# Patient Record
Sex: Male | Born: 1945 | Race: White | Hispanic: No
Health system: Southern US, Community
[De-identification: ages and names within clinical notes are randomized; demographics above are authoritative.]

---

## 2019-07-03 ENCOUNTER — Other Ambulatory Visit (HOSPITAL_COMMUNITY): Payer: Self-pay | Admitting: Physician Assistant

## 2019-07-03 DIAGNOSIS — R911 Solitary pulmonary nodule: Secondary | ICD-10-CM

## 2019-07-16 ENCOUNTER — Ambulatory Visit (HOSPITAL_COMMUNITY)
Admission: RE | Admit: 2019-07-16 | Discharge: 2019-07-16 | Disposition: A | Discharge status: Home / Self Care | Payer: Medicare HMO | Source: Ambulatory Visit | Attending: Physician Assistant | Admitting: Physician Assistant

## 2019-07-16 ENCOUNTER — Other Ambulatory Visit: Payer: Self-pay

## 2019-07-16 DIAGNOSIS — R911 Solitary pulmonary nodule: Secondary | ICD-10-CM | POA: Diagnosis not present

## 2019-07-16 MED ORDER — FLUDEOXYGLUCOSE F - 18 (FDG) INJECTION
13.9000 | Freq: Once | INTRAVENOUS | Status: AC | PRN
  Administered 2019-07-16: 15:00:00 13.9 via INTRAVENOUS

## 2019-07-17 ENCOUNTER — Other Ambulatory Visit (HOSPITAL_COMMUNITY): Payer: Self-pay | Admitting: Physician Assistant

## 2019-08-16 ENCOUNTER — Institutional Professional Consult (permissible substitution): Payer: Medicare HMO | Admitting: Emergency Medicine

## 2019-10-04 ENCOUNTER — Institutional Professional Consult (permissible substitution): Payer: Medicare HMO | Admitting: Emergency Medicine

## 2019-10-04 ENCOUNTER — Telehealth: Payer: Self-pay | Admitting: Emergency Medicine

## 2019-10-04 NOTE — Telephone Encounter (Signed)
Left pt voicemail to call office to reschedule missed consult with RB. Pt needs to reschedule with either RB or BI per RB.

## 2020-03-05 ENCOUNTER — Other Ambulatory Visit: Payer: Self-pay | Admitting: Physician Assistant

## 2020-03-05 ENCOUNTER — Other Ambulatory Visit (HOSPITAL_COMMUNITY): Payer: Self-pay | Admitting: Physician Assistant

## 2020-03-05 DIAGNOSIS — R911 Solitary pulmonary nodule: Secondary | ICD-10-CM

## 2020-12-01 IMAGING — PT NM PET TUM IMG INITIAL (PI) SKULL BASE T - THIGH
3 series · 21 of 25 positions shown · non-contrast
Comparison: No prior imaging or reports submitted.

CLINICAL DATA: Initial treatment strategy for lung nodule.
Diabetic. Staging. COVID vaccine x1 in left arm 5 days ago.

EXAM:
NUCLEAR MEDICINE PET SKULL BASE TO THIGH
TECHNIQUE: 13.9 mCi F-18 FDG was injected intravenously. Full-ring PET imaging
was performed from the skull base to thigh after the radiotracer. CT
data was obtained and used for attenuation correction and anatomic
localization.
Fasting blood glucose: 193 mg/dl

[axial ct wb fusion · 16 of 194 slices shown]
[im 1/194]
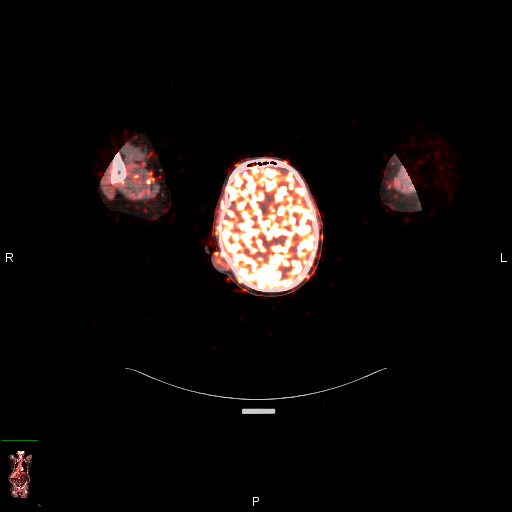
[im 11/194]
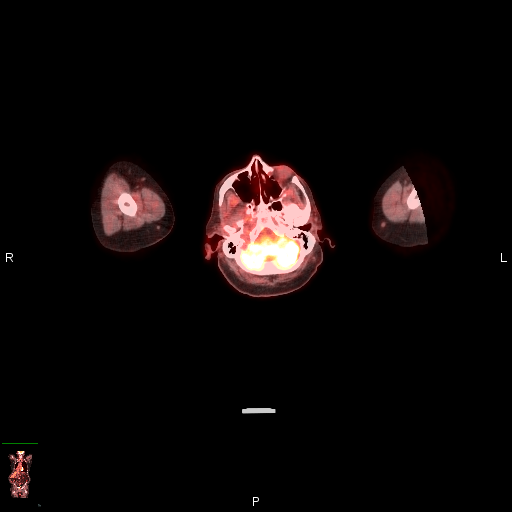
[im 22/194]
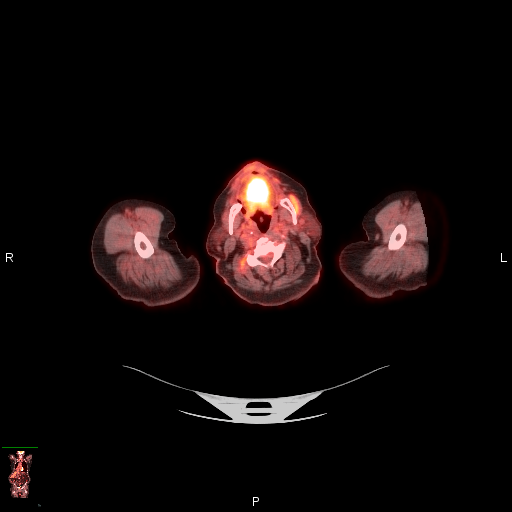
[im 43/194]
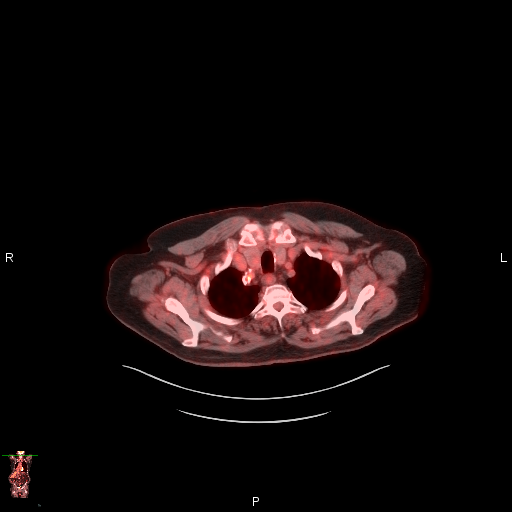
[im 54/194]
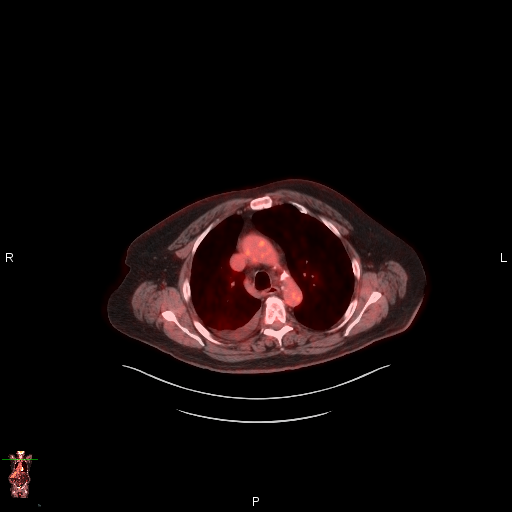
[im 65/194]
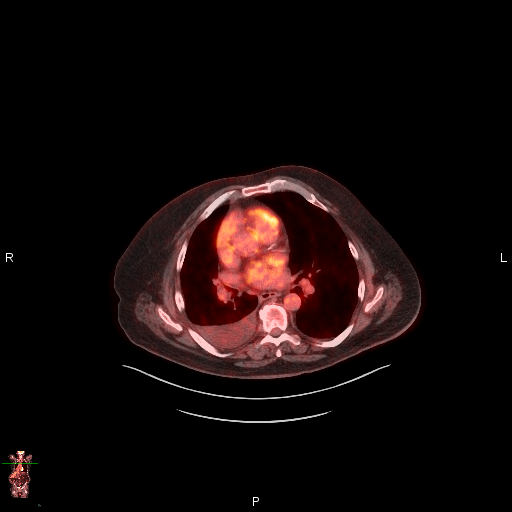
[im 76/194]
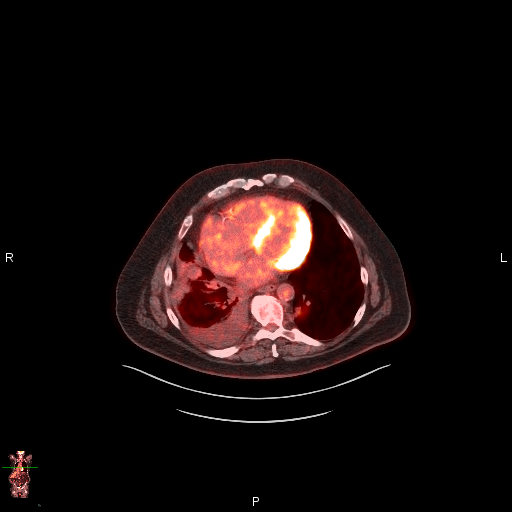
[im 86/194]
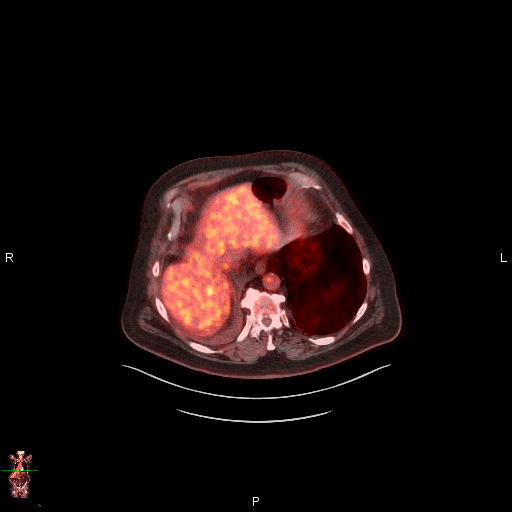
[im 108/194]
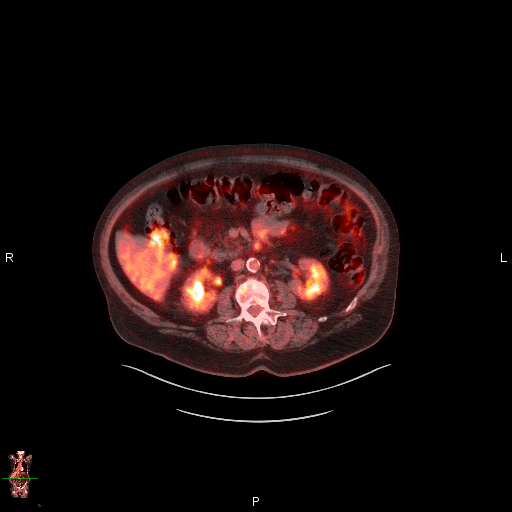
[im 118/194]
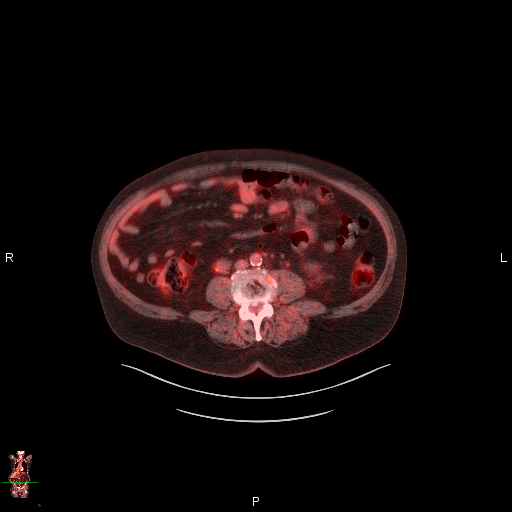
[im 129/194]
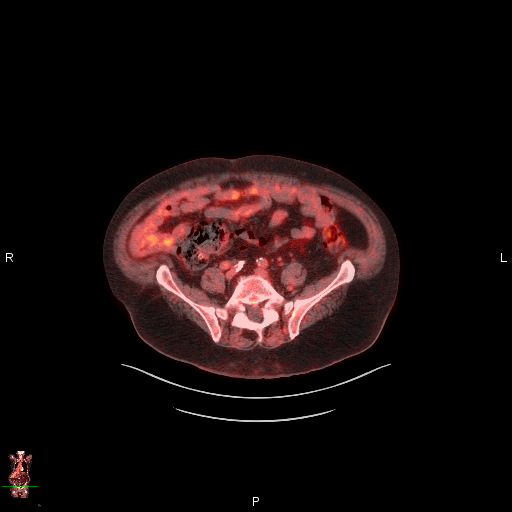
[im 140/194]
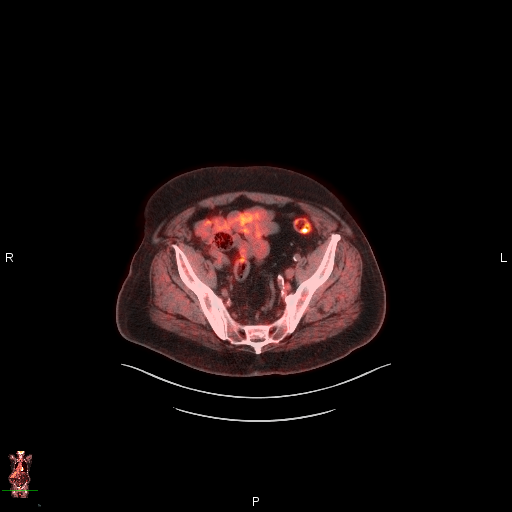
[im 151/194]
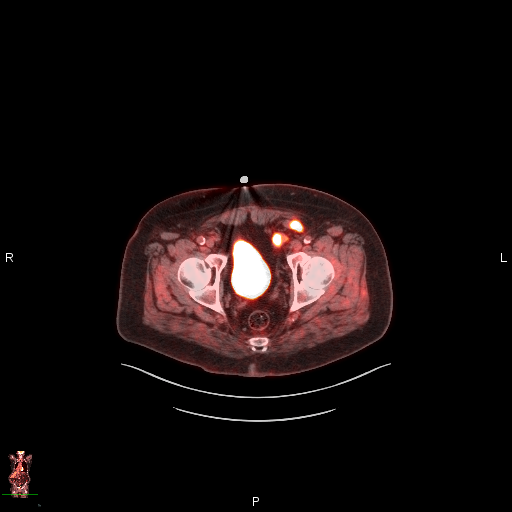
[im 172/194]
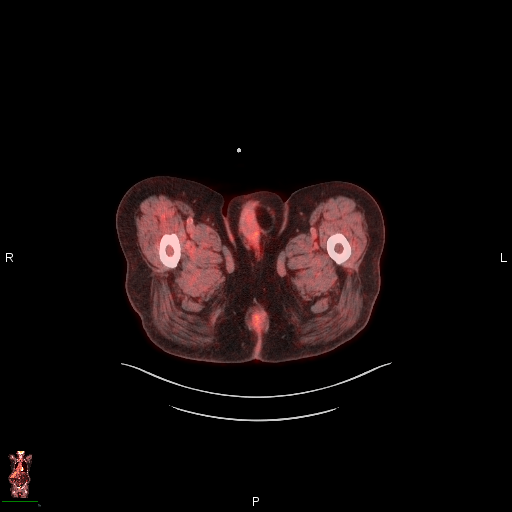
[im 183/194]
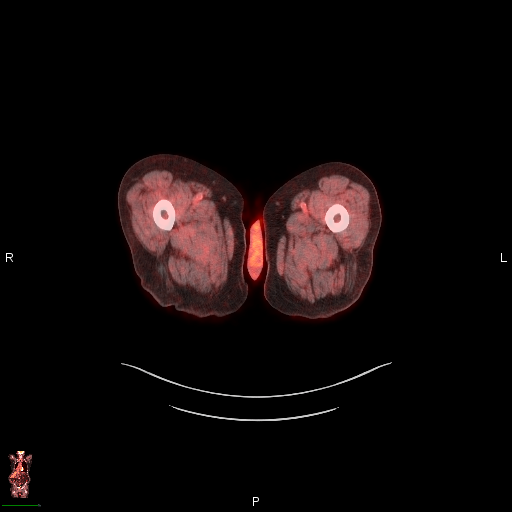
[im 194/194]
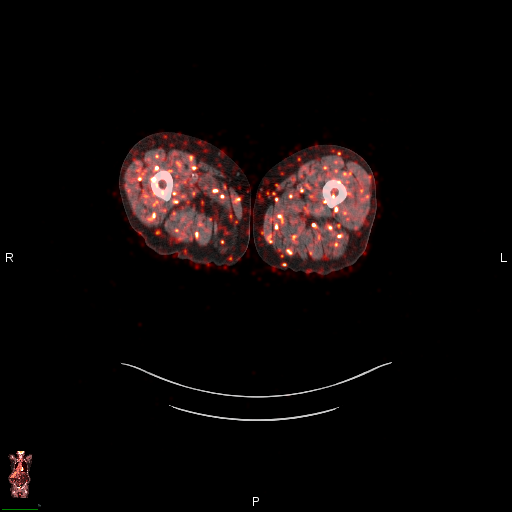

[coronal ct wb fusion · 1 of 15 slices shown]
[im 1/15]
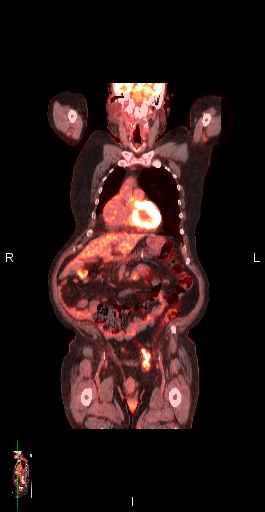

[mip · 4 of 48 slices shown]
[im 1/48]
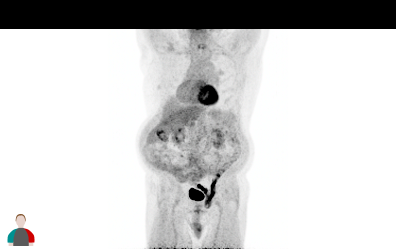
[im 24/48]
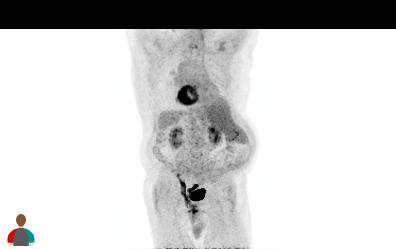
[im 36/48]
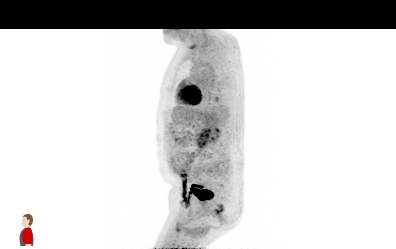
[im 48/48]
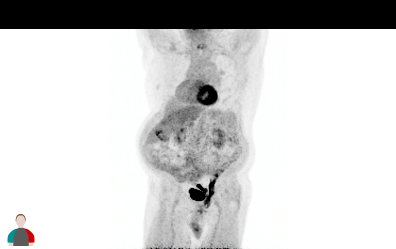

[21 of 25 positions shown; findings below may reference images not displayed]

FINDINGS: Mediastinal blood pool activity: SUV max

Liver activity: SUV max NA

NECK: No areas of abnormal hypermetabolism.

Incidental CT findings: Presumed sebaceous cyst about the right
scalp at 2.7 cm. No cervical adenopathy. Bilateral carotid
atherosclerosis.

CHEST: Extensive pulmonary nodules. A left upper lobe nodule
measures 2.0 cm and a S.U.V. max of 1.8 on 115/3.

More posterior left upper lobe pulmonary nodule measures 2.4 cm and
a S.U.V. max of 2.6.

The most FDG avid nodule is within the left lower lobe, measuring
2.2 cm and a S.U.V. max of 3.7 on 155/3. These nodules are
relatively well-circumscribed.

No thoracic nodal hypermetabolism.

Incidental CT findings: Centrilobular emphysema. Extensive motion
degradation. Index right upper lobe pulmonary nodule of 9 mm on
102/3. Small right pleural effusion with adjacent right lower lobe
atelectasis. Aortic and coronary artery atherosclerosis. Mild
cardiomegaly.

ABDOMEN/PELVIS: No abdominopelvic parenchymal or nodal
hypermetabolism.

Incidental CT findings: Normal adrenal glands. Motion degradation
within both the chest and abdomen. Abdominal aortic atherosclerosis.
The appendix crosses the midline, terminating in the left
paracentral abdomen.

Mild prostatomegaly. Left inguinal hernia contains nonobstructive
sigmoid colon and fat.

SKELETON: No abnormal marrow activity.

Incidental CT findings: Bilateral L5 pars defects.
IMPRESSION: 1. Left greater than right relatively well-circumscribed pulmonary
nodules with low-level hypermetabolism. Considerations include
metastatic disease from an unknown primary or benign
infectious/inflammatory nodules. Recommend multidisciplinary
thoracic oncology consultation for possible tissue sampling.
2. No hypermetabolic primary or thoracic nodal disease identified.
3. Small right pleural effusion.
4. Aortic atherosclerosis ([id number]), coronary artery
atherosclerosis and emphysema ([id number]).
5. Left inguinal hernia containing nonobstructive sigmoid colon.
